# Patient Record
Sex: Female | Born: 1960 | Race: Black or African American | Hispanic: No | Marital: Single | Smoking: Former smoker
Health system: Southern US, Community
[De-identification: ages and names within clinical notes are randomized; demographics above are authoritative.]

## PROBLEM LIST (undated history)

## (undated) DIAGNOSIS — J45909 Unspecified asthma, uncomplicated: Secondary | ICD-10-CM

---

## 1999-01-21 ENCOUNTER — Emergency Department (HOSPITAL_COMMUNITY): Admission: EM | Admit: 1999-01-21 | Discharge: 1999-01-22 | Payer: Self-pay | Admitting: Emergency Medicine

## 1999-04-08 ENCOUNTER — Encounter: Payer: Self-pay | Admitting: Emergency Medicine

## 1999-04-08 ENCOUNTER — Emergency Department (HOSPITAL_COMMUNITY): Admission: EM | Admit: 1999-04-08 | Discharge: 1999-04-08 | Payer: Self-pay | Admitting: Emergency Medicine

## 1999-10-24 ENCOUNTER — Emergency Department (HOSPITAL_COMMUNITY): Admission: EM | Admit: 1999-10-24 | Discharge: 1999-10-24 | Payer: Self-pay | Admitting: Emergency Medicine

## 1999-10-24 ENCOUNTER — Encounter: Payer: Self-pay | Admitting: Emergency Medicine

## 2000-03-10 ENCOUNTER — Emergency Department (HOSPITAL_COMMUNITY): Admission: EM | Admit: 2000-03-10 | Discharge: 2000-03-10 | Payer: Self-pay

## 2000-03-22 ENCOUNTER — Emergency Department (HOSPITAL_COMMUNITY): Admission: EM | Admit: 2000-03-22 | Discharge: 2000-03-22 | Payer: Self-pay | Admitting: *Deleted

## 2000-03-28 ENCOUNTER — Emergency Department (HOSPITAL_COMMUNITY): Admission: EM | Admit: 2000-03-28 | Discharge: 2000-03-28 | Payer: Self-pay | Admitting: Emergency Medicine

## 2000-03-28 ENCOUNTER — Encounter: Payer: Self-pay | Admitting: *Deleted

## 2001-01-26 ENCOUNTER — Emergency Department (HOSPITAL_COMMUNITY): Admission: EM | Admit: 2001-01-26 | Discharge: 2001-01-27 | Payer: Self-pay | Admitting: Emergency Medicine

## 2001-10-14 ENCOUNTER — Emergency Department (HOSPITAL_COMMUNITY): Admission: EM | Admit: 2001-10-14 | Discharge: 2001-10-14 | Payer: Self-pay

## 2002-09-07 ENCOUNTER — Emergency Department (HOSPITAL_COMMUNITY): Admission: EM | Admit: 2002-09-07 | Discharge: 2002-09-07 | Payer: Self-pay | Admitting: Emergency Medicine

## 2002-09-07 ENCOUNTER — Encounter: Payer: Self-pay | Admitting: Emergency Medicine

## 2003-08-14 ENCOUNTER — Emergency Department (HOSPITAL_COMMUNITY): Admission: EM | Admit: 2003-08-14 | Discharge: 2003-08-14 | Payer: Self-pay | Admitting: Emergency Medicine

## 2004-08-23 ENCOUNTER — Emergency Department (HOSPITAL_COMMUNITY): Admission: EM | Admit: 2004-08-23 | Discharge: 2004-08-23 | Payer: Self-pay | Admitting: *Deleted

## 2004-11-20 ENCOUNTER — Emergency Department (HOSPITAL_COMMUNITY): Admission: EM | Admit: 2004-11-20 | Discharge: 2004-11-20 | Payer: Self-pay | Admitting: Emergency Medicine

## 2005-06-18 ENCOUNTER — Emergency Department (HOSPITAL_COMMUNITY): Admission: EM | Admit: 2005-06-18 | Discharge: 2005-06-18 | Payer: Self-pay | Admitting: Dentistry

## 2005-08-02 ENCOUNTER — Emergency Department (HOSPITAL_COMMUNITY): Admission: EM | Admit: 2005-08-02 | Discharge: 2005-08-02 | Payer: Self-pay | Admitting: Emergency Medicine

## 2005-08-26 ENCOUNTER — Emergency Department (HOSPITAL_COMMUNITY): Admission: EM | Admit: 2005-08-26 | Discharge: 2005-08-27 | Payer: Self-pay | Admitting: Emergency Medicine

## 2006-04-27 ENCOUNTER — Emergency Department (HOSPITAL_COMMUNITY): Admission: EM | Admit: 2006-04-27 | Discharge: 2006-04-27 | Payer: Self-pay | Admitting: Family Medicine

## 2006-04-27 ENCOUNTER — Emergency Department (HOSPITAL_COMMUNITY): Admission: EM | Admit: 2006-04-27 | Discharge: 2006-04-27 | Payer: Self-pay | Admitting: Emergency Medicine

## 2006-04-28 ENCOUNTER — Emergency Department (HOSPITAL_COMMUNITY): Admission: EM | Admit: 2006-04-28 | Discharge: 2006-04-28 | Payer: Self-pay | Admitting: Family Medicine

## 2006-04-28 ENCOUNTER — Ambulatory Visit (HOSPITAL_COMMUNITY): Admission: RE | Admit: 2006-04-28 | Discharge: 2006-04-28 | Payer: Self-pay | Admitting: Family Medicine

## 2006-05-14 IMAGING — CR DG LUMBAR SPINE COMPLETE 4+V
5 series · 5 of 5 positions shown · non-contrast
Comparison: none

CLINICAL DATA: No known injury.  Low back pain. 
 LUMBAR SPINE - 4 VIEW:

[t l-spine a.p.]
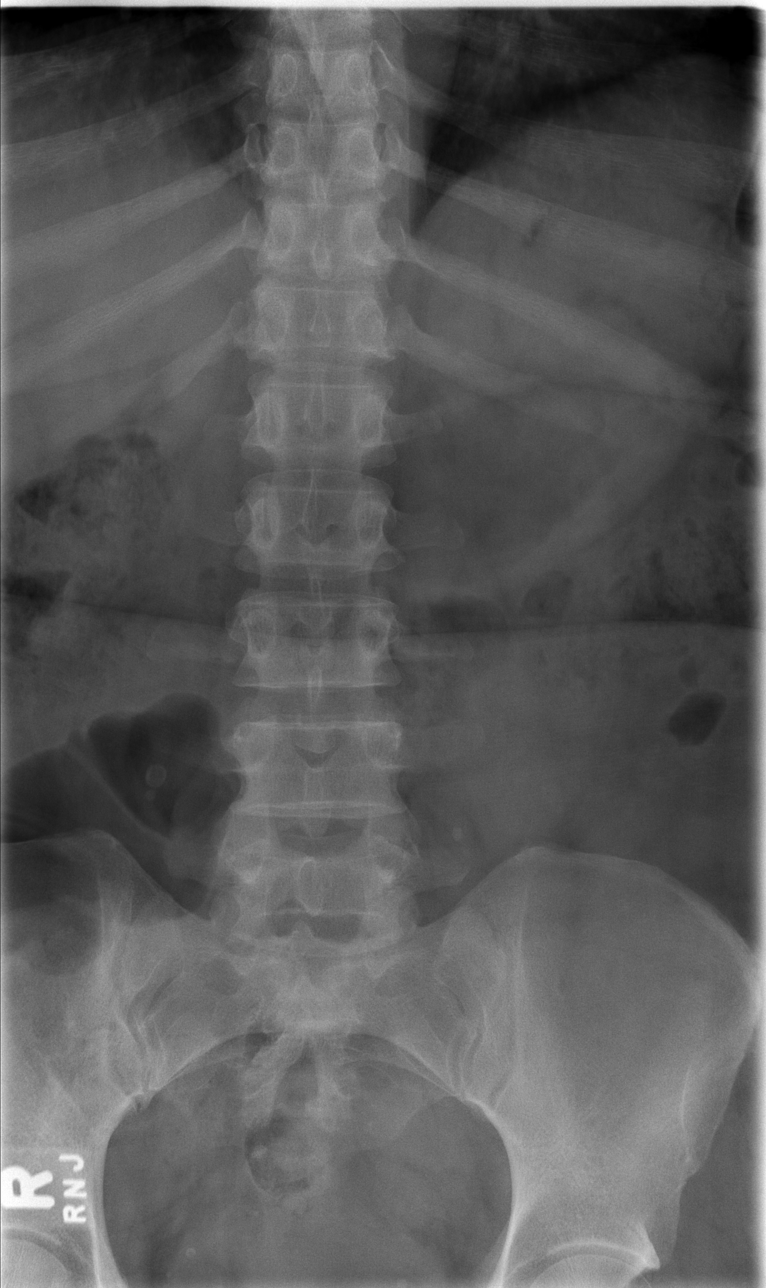

[t l-spine oblique exposure (1 of 2)]
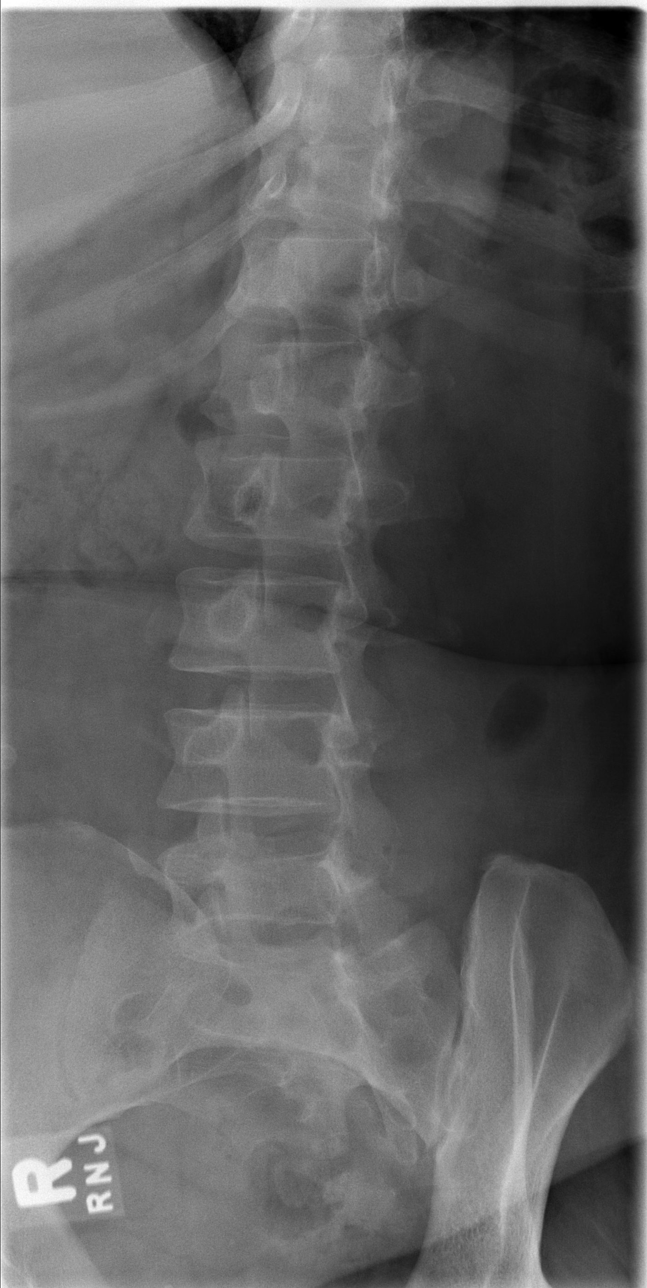

[t l-spine oblique exposure (2 of 2)]
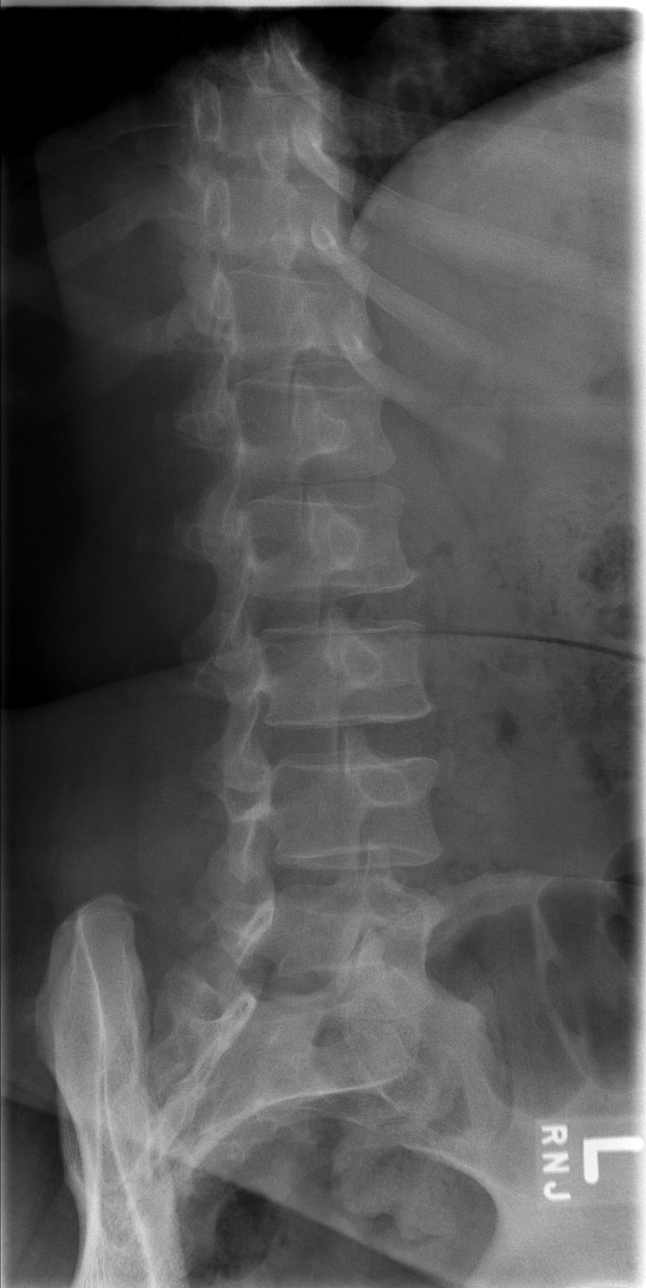

[t l-spine lat]
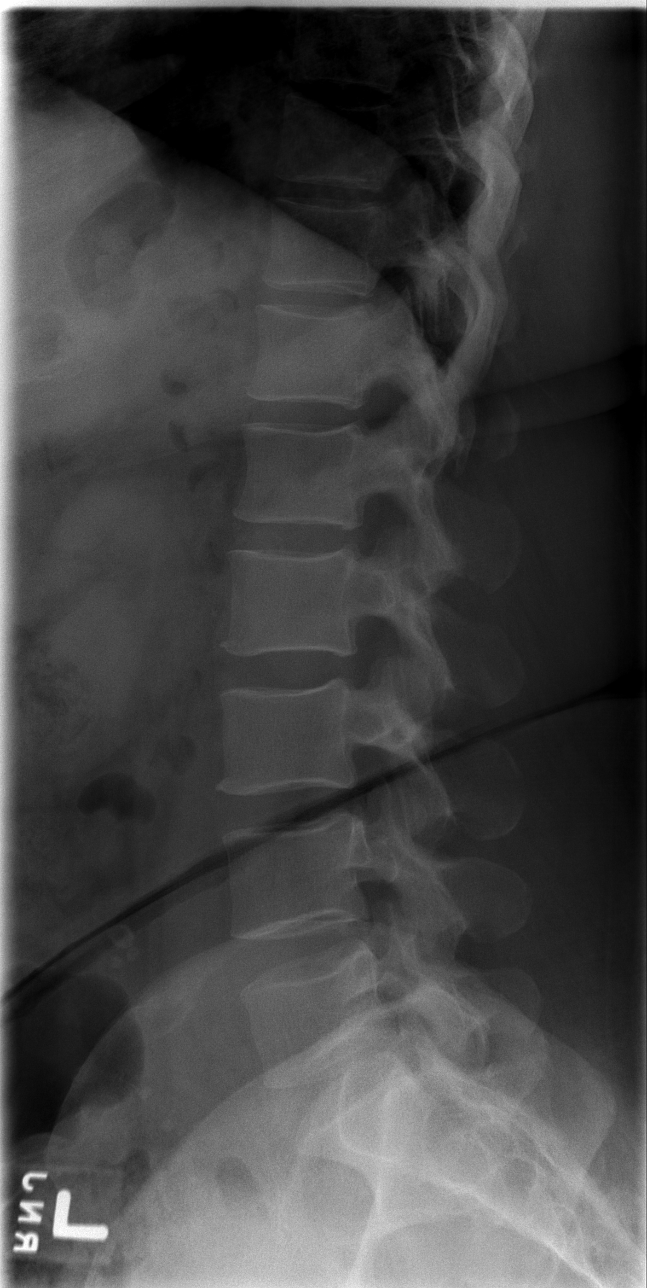

[t l-spine l5-s1 spot]
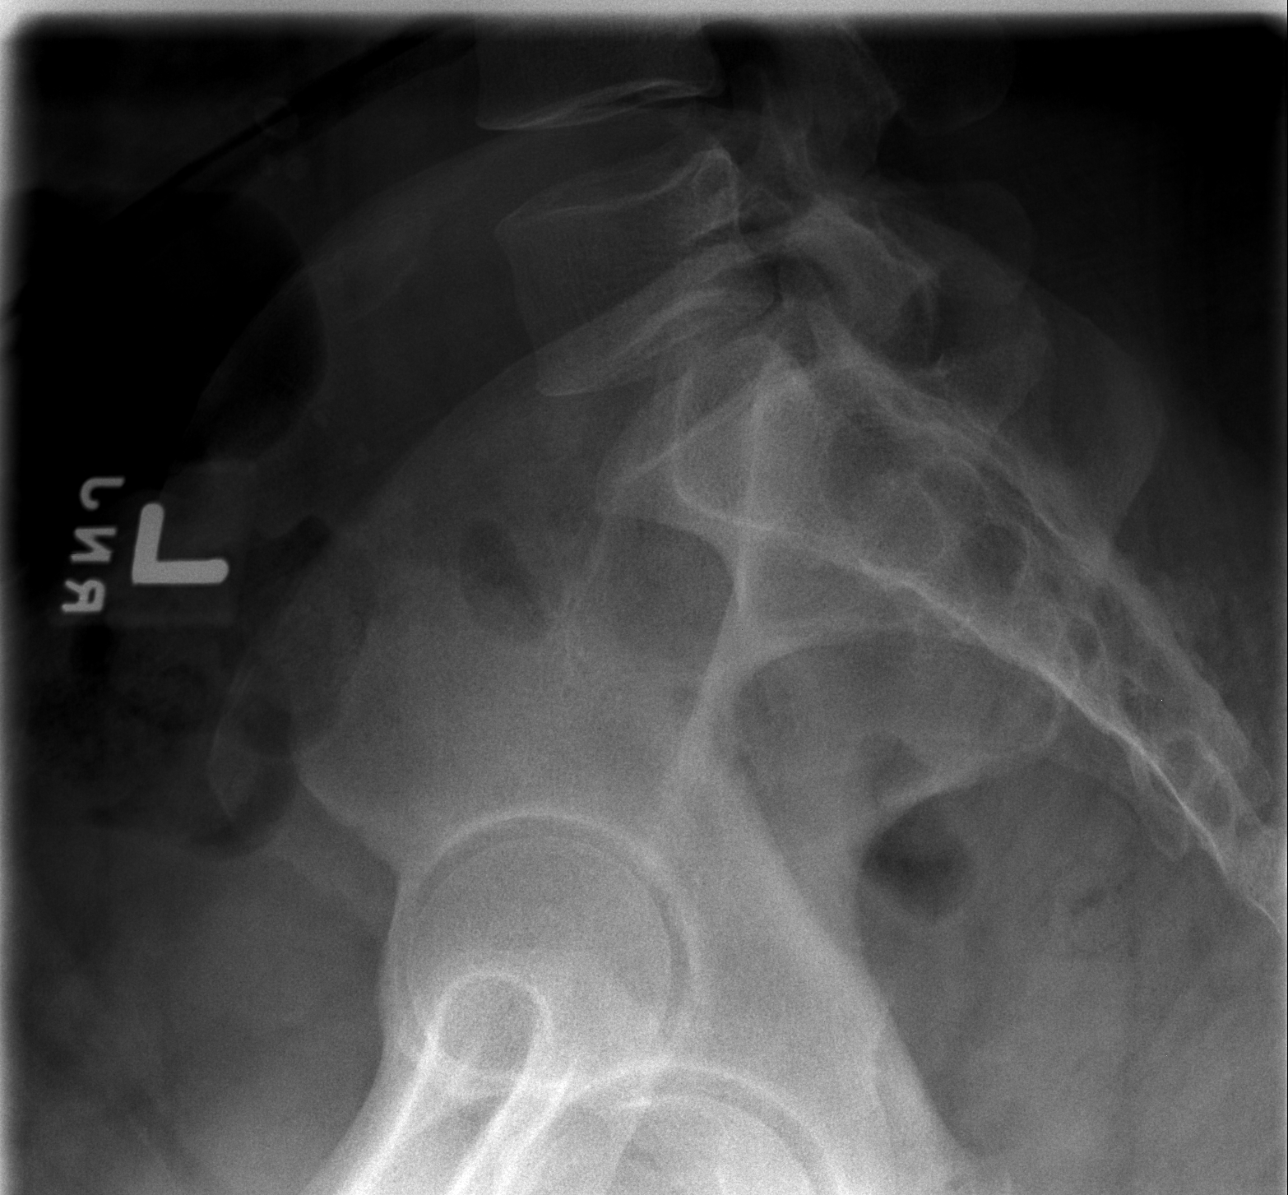

[5 of 5 positions shown; findings below may reference images not displayed]

FINDINGS: Vertebral body height and alignment are maintained.  There is some minimal endplate spurring.  Intervertebral disk space height appears maintained.  There are some small calcifications seen at the L4-5 level.  Some of these on the right demonstrate central lucency and are compatible with phleboliths.  On the left, a ureteral stone cannot be excluded.
IMPRESSION: 1.  No acute abnormality in the lumbar spine. 
 2.  Calcification over the left L5 process, which likely represents a phlebolith but could be a distal ureteral calculus.

## 2006-07-16 ENCOUNTER — Emergency Department (HOSPITAL_COMMUNITY): Admission: EM | Admit: 2006-07-16 | Discharge: 2006-07-16 | Payer: Self-pay | Admitting: Emergency Medicine

## 2007-05-07 ENCOUNTER — Emergency Department (HOSPITAL_COMMUNITY): Admission: EM | Admit: 2007-05-07 | Discharge: 2007-05-07 | Payer: Self-pay | Admitting: Emergency Medicine

## 2007-10-18 ENCOUNTER — Emergency Department (HOSPITAL_COMMUNITY): Admission: EM | Admit: 2007-10-18 | Discharge: 2007-10-18 | Payer: Self-pay | Admitting: Emergency Medicine

## 2008-04-27 ENCOUNTER — Emergency Department (HOSPITAL_COMMUNITY): Admission: EM | Admit: 2008-04-27 | Discharge: 2008-04-27 | Payer: Self-pay | Admitting: Family Medicine

## 2008-07-05 ENCOUNTER — Emergency Department (HOSPITAL_COMMUNITY): Admission: EM | Admit: 2008-07-05 | Discharge: 2008-07-05 | Payer: Self-pay | Admitting: Emergency Medicine

## 2009-03-28 ENCOUNTER — Emergency Department (HOSPITAL_COMMUNITY): Admission: EM | Admit: 2009-03-28 | Discharge: 2009-03-28 | Payer: Self-pay | Admitting: Emergency Medicine

## 2011-08-18 LAB — RAPID URINE DRUG SCREEN, HOSP PERFORMED
Barbiturates: NOT DETECTED
Benzodiazepines: NOT DETECTED
Cocaine: NOT DETECTED
Opiates: NOT DETECTED

## 2011-08-27 LAB — BASIC METABOLIC PANEL: GFR calc non Af Amer: >60

## 2011-08-27 LAB — CBC
Hemoglobin: 13.9
RBC: 4.38

## 2011-08-27 LAB — POCT CARDIAC MARKERS
CKMB, poc: <1 — ABNORMAL LOW
Myoglobin, poc: 46.4
Operator id: 4761
Troponin i, poc: 0.07 — ABNORMAL HIGH

## 2011-08-27 LAB — DIFFERENTIAL
Basophils Absolute: 0
Eosinophils Relative: 4
Lymphocytes Relative: 48 — ABNORMAL HIGH
Monocytes Absolute: 0.8 — ABNORMAL HIGH
Monocytes Relative: 11
Neutro Abs: 2.6
Neutrophils Relative %: 37 — ABNORMAL LOW

## 2011-08-27 LAB — TROPONIN I: Troponin I: 0.01

## 2019-05-06 ENCOUNTER — Encounter (HOSPITAL_COMMUNITY): Payer: Self-pay

## 2019-05-06 ENCOUNTER — Other Ambulatory Visit: Payer: Self-pay

## 2019-05-06 ENCOUNTER — Ambulatory Visit (HOSPITAL_COMMUNITY)
Admission: EM | Admit: 2019-05-06 | Discharge: 2019-05-06 | Disposition: A | Discharge status: Home / Self Care | Payer: 59 | Attending: Internal Medicine | Admitting: Internal Medicine

## 2019-05-06 DIAGNOSIS — B354 Tinea corporis: Secondary | ICD-10-CM | POA: Diagnosis not present

## 2019-05-06 DIAGNOSIS — J301 Allergic rhinitis due to pollen: Secondary | ICD-10-CM

## 2019-05-06 HISTORY — DX: Unspecified asthma, uncomplicated: J45.909

## 2019-05-06 MED ORDER — MICONAZOLE NITRATE 2 % EX CREA
1.0000 | TOPICAL_CREAM | Freq: Two times a day (BID) | CUTANEOUS | 0 refills | Status: AC
Start: 2019-05-06 — End: ?

## 2019-05-06 MED ORDER — FLUTICASONE PROPIONATE 50 MCG/ACT NA SUSP: 1.0000 | Freq: Every day | NASAL | 1 refills | Status: AC

## 2019-05-06 MED ORDER — LORATADINE 10 MG PO TABS: 10.0000 mg | ORAL_TABLET | Freq: Every day | ORAL | Status: AC

## 2019-05-06 NOTE — ED Triage Notes (Signed)
Patient presents to Urgent Care with complaints of itchy rash on left elbow since 6 days ago. Patient reports it has spread and continues to itch despite otc cream application.

## 2019-05-06 NOTE — ED Provider Notes (Signed)
[place]    CSN: [id number] Arrival date & time: 05/06/19  0920     History   Chief Complaint Chief Complaint  Patient presents with  . Insect Bite    HPI Victoria Navarro is a 58 y.o. female with a history of asthma comes to urgent care with complaints of pruritic rash that started about a week ago.  No rash has been progressing over the past 6 days.  Patient has tried over-the-counter cream with no improvement.  Patient suspect that she was bitten by an insect.  No fever or chills.  Rashes not painful.   HPI  Past Medical History:  Diagnosis Date  . Asthma     There are no active problems to display for this patient.   History reviewed. No pertinent surgical history.  OB History   No obstetric history on file.      Home Medications    Prior to Admission medications   Medication Sig Start Date End Date Taking? Authorizing Provider  albuterol (VENTOLIN HFA) 108 (90 Base) MCG/ACT inhaler Inhale 1-2 puffs into the lungs every 6 (six) hours as needed for wheezing or shortness of breath.   Yes [provider]  fluticasone (FLONASE) 50 MCG/ACT nasal spray Place 1 spray into both nostrils daily. 05/06/19   Chase Picket, MD  loratadine (CLARITIN) 10 MG tablet Take 1 tablet (10 mg total) by mouth daily. 05/06/19   , Myrene Galas, MD  miconazole (MICOTIN) 2 % cream Apply 1 application topically 2 (two) times daily. 05/06/19   Myrene Galas, MD    Family History Family History  Problem Relation Age of Onset  . Cancer Mother   . Healthy Father     Social History Social History   Tobacco Use  . Smoking status: Former Smoker    Types: Cigarettes    Quit date: 2017    Years since quitting: 3.4  . Smokeless tobacco: Never Used  Substance Use Topics  . Alcohol use: Yes    Comment: occasionally  . Drug use: Not on file     Allergies   Patient has no known allergies.   Review of Systems Review of Systems  Constitutional:  Negative.   HENT: Positive for rhinorrhea. Negative for congestion, mouth sores, nosebleeds, sinus pressure, sinus pain, sneezing, tinnitus and voice change.   Respiratory: Negative.  Negative for chest tightness, shortness of breath and wheezing.   Cardiovascular: Negative.   Gastrointestinal: Negative for abdominal distention, diarrhea, nausea and vomiting.  Skin: Positive for color change and rash. Negative for wound.     Physical Exam Triage Vital Signs ED Triage Vitals  Enc Vitals Group     BP 05/06/19 1017 120/84     Pulse Rate 05/06/19 1017 83     Resp 05/06/19 1017 18     Temp 05/06/19 1017 98.4 F (36.9 C)     Temp Source 05/06/19 1017 Oral     SpO2 05/06/19 1017 100 %     Weight --      Height --      Head Circumference --      Peak Flow --      Pain Score 05/06/19 1012 0     Pain Loc --      Pain Edu? --      Excl. in [place]? --    No data found.  Updated Vital Signs BP 120/84 (BP Location: Right Arm)   Pulse 83   Temp 98.4 F (  36.9 C) (Oral)   Resp 18   LMP 11/10/2006   SpO2 100%   Visual Acuity Right Eye Distance:   Left Eye Distance:   Bilateral Distance:    Right Eye Near:   Left Eye Near:    Bilateral Near:     Physical Exam Constitutional:      General: She is not in acute distress.    Appearance: She is not ill-appearing or toxic-appearing.  HENT:     Right Ear: Tympanic membrane normal.     Left Ear: Tympanic membrane normal.     Nose: Nose normal.     Mouth/Throat:     Mouth: Mucous membranes are moist.  Cardiovascular:     Rate and Rhythm: Normal rate and regular rhythm.     Pulses: Normal pulses.     Heart sounds: Normal heart sounds.  Pulmonary:     Effort: Pulmonary effort is normal.     Breath sounds: Normal breath sounds.  Abdominal:     General: Bowel sounds are normal.     Palpations: Abdomen is soft.  Musculoskeletal: Normal range of motion.  Skin:    Capillary Refill: Capillary refill takes less than 2 seconds.      Comments: Left upper arm lesion shows a circumferential rash with central clearing.  Few excoriations over the area.  Rash is about 3 inches in the longest diameter.  Neurological:     Mental Status: She is alert.        UC Treatments / Results  Labs (all labs ordered are listed, but only abnormal results are displayed) Labs Reviewed - No data to display  EKG None  Radiology No results found.  Procedures Procedures (including critical care time)  Medications Ordered in UC Medications - No data to display  Initial Impression / Assessment and Plan / UC Course  I have reviewed the triage vital signs and the nursing notes.  Pertinent labs & imaging results that were available during my care of the patient were reviewed by me and considered in my medical decision making (see chart for details).     1. Tinea corporis: Miconazole to be applied twice daily If patient notices any worsening rash or lesion, she can return to urgent care to be reevaluated  2.  Seasonal allergic rhinitis: Fluticasone nasal spray Continue Claritin use No signs of shortness of breath. Final Clinical Impressions(s) / UC Diagnoses   Final diagnoses:  Tinea corporis  Seasonal allergic rhinitis due to pollen   Discharge Instructions   None    ED Prescriptions    Medication Sig Dispense Auth. Provider   fluticasone (FLONASE) 50 MCG/ACT nasal spray Place 1 spray into both nostrils daily. 16 g , Britta Mccreedy O, MD   miconazole (MICOTIN) 2 % cream Apply 1 application topically 2 (two) times daily. 28.35 g Merrilee Jansky,  O, MD   loratadine (CLARITIN) 10 MG tablet Take 1 tablet (10 mg total) by mouth daily.  Merrilee Jansky,  O, MD     Controlled Substance Prescriptions [place] Controlled Substance Registry consulted? No   Merrilee Jansky,  O, MD 05/06/19 1910
